# Patient Record
Sex: Male | Born: 1970 | Race: White | Hispanic: No | Marital: Single | Smoking: Current every day smoker
Health system: Southern US, Community
[De-identification: ages and names within clinical notes are randomized; demographics above are authoritative.]

## PROBLEM LIST (undated history)

## (undated) DIAGNOSIS — Z8719 Personal history of other diseases of the digestive system: Secondary | ICD-10-CM

## (undated) DIAGNOSIS — J209 Acute bronchitis, unspecified: Secondary | ICD-10-CM

## (undated) DIAGNOSIS — Z8711 Personal history of peptic ulcer disease: Secondary | ICD-10-CM

## (undated) DIAGNOSIS — A4902 Methicillin resistant Staphylococcus aureus infection, unspecified site: Secondary | ICD-10-CM

---

## 2012-09-27 ENCOUNTER — Emergency Department (HOSPITAL_COMMUNITY)
Admission: EM | Admit: 2012-09-27 | Discharge: 2012-09-27 | Disposition: A | Discharge status: Home / Self Care | Payer: Self-pay | Attending: Emergency Medicine | Admitting: Emergency Medicine

## 2012-09-27 ENCOUNTER — Emergency Department (HOSPITAL_COMMUNITY): Payer: Self-pay

## 2012-09-27 ENCOUNTER — Encounter (HOSPITAL_COMMUNITY): Payer: Self-pay | Admitting: Emergency Medicine

## 2012-09-27 DIAGNOSIS — M79609 Pain in unspecified limb: Secondary | ICD-10-CM | POA: Insufficient documentation

## 2012-09-27 DIAGNOSIS — Z87828 Personal history of other (healed) physical injury and trauma: Secondary | ICD-10-CM | POA: Insufficient documentation

## 2012-09-27 DIAGNOSIS — M5441 Lumbago with sciatica, right side: Secondary | ICD-10-CM

## 2012-09-27 DIAGNOSIS — Z8709 Personal history of other diseases of the respiratory system: Secondary | ICD-10-CM | POA: Insufficient documentation

## 2012-09-27 DIAGNOSIS — F17200 Nicotine dependence, unspecified, uncomplicated: Secondary | ICD-10-CM | POA: Insufficient documentation

## 2012-09-27 DIAGNOSIS — Z8614 Personal history of Methicillin resistant Staphylococcus aureus infection: Secondary | ICD-10-CM | POA: Insufficient documentation

## 2012-09-27 DIAGNOSIS — M5430 Sciatica, unspecified side: Secondary | ICD-10-CM | POA: Insufficient documentation

## 2012-09-27 HISTORY — DX: Methicillin resistant Staphylococcus aureus infection, unspecified site: A49.02

## 2012-09-27 HISTORY — DX: Acute bronchitis, unspecified: J20.9

## 2012-09-27 MED ORDER — NAPROXEN 500 MG PO TABS
500.0000 mg | ORAL_TABLET | Freq: Two times a day (BID) | ORAL | Status: AC
Start: 2012-09-27 — End: ?

## 2012-09-27 MED ORDER — HYDROCODONE-ACETAMINOPHEN 5-325 MG PO TABS: 1.0000 | ORAL_TABLET | ORAL | Status: DC | PRN

## 2012-09-27 MED ORDER — CYCLOBENZAPRINE HCL 10 MG PO TABS: 10.0000 mg | ORAL_TABLET | Freq: Two times a day (BID) | ORAL | Status: AC | PRN

## 2012-09-27 NOTE — ED Provider Notes (Signed)
History    CSN: [id number] Arrival date & time 09/27/12  1625  First MD Initiated Contact with Patient 09/27/12 1635     Chief Complaint  Patient presents with  . Back Pain  . Leg Pain   (Consider location/radiation/quality/duration/timing/severity/associated sxs/prior Treatment) Patient is a 42 y.o. male presenting with back pain and leg pain. The history is provided by the patient.  Back Pain Location:  Lumbar spine Radiates to:  R posterior upper leg and R knee Pain severity:  Severe Pain is:  Same all the time Onset quality:  Gradual Duration: 2 years. Timing:  Intermittent Progression:  Worsening Chronicity:  Chronic Relieved by:  Nothing Worsened by:  Standing, twisting and movement Associated symptoms: leg pain   Associated symptoms: no abdominal pain, no bladder incontinence, no bowel incontinence, no chest pain, no dysuria, no fever and no headaches   Leg Pain Associated symptoms: back pain   Associated symptoms: no fever and no neck pain    Alan Lawrence is a 42 y.o. male who presents to the ED with low back pain and right knee pain. He states that after injury of his back for a fall over 2 years ago he has chronic back pain. For the past few days the pain has become sever and he has pain that radiates down his right leg and is causing pain in his right knee. He sometimes feels a pop in his lower back and then has severe pain but then it pops again and the pain goes away.  He has not been evaluated by an orthopaedist because he has no insurance. Nothing helps the pain.    Past Medical History  Diagnosis Date  . Bronchitis, acute   . MRSA (methicillin resistant Staphylococcus aureus) infection    History reviewed. No pertinent past surgical history. Family History  Problem Relation Age of Onset  . Cancer Other    History  Substance Use Topics  . Smoking status: Current Every Day Smoker -- 0.75 packs/day for 16 years    Types: Cigarettes  . Smokeless tobacco:  Never Used  . Alcohol Use: No    Review of Systems  Constitutional: Negative for fever, chills and appetite change.  HENT: Negative for neck pain.   Respiratory: Negative for cough.   Cardiovascular: Negative for chest pain.  Gastrointestinal: Negative for nausea, vomiting, abdominal pain and bowel incontinence.  Genitourinary: Negative for bladder incontinence, dysuria, urgency and frequency.  Musculoskeletal: Positive for back pain.  Skin: Negative for wound.  Neurological: Negative for light-headedness and headaches.  Psychiatric/Behavioral: The patient is not nervous/anxious.     Allergies  Codeine; Dichlorphenamide; and Tramadol  Home Medications  No current outpatient prescriptions on file. BP 142/84  Pulse 75  Temp(Src) 98.3 F (36.8 C) (Oral)  Resp 20  Ht 6' 0.5" (1.842 m)  Wt 230 lb (104.327 kg)  BMI 30.75 kg/m2  SpO2 98% Physical Exam  Nursing note and vitals reviewed. Constitutional: He is oriented to person, place, and time. He appears well-developed and well-nourished. No distress.  HENT:  Head: Normocephalic and atraumatic.  Eyes: Conjunctivae and EOM are normal. Pupils are equal, round, and reactive to light.  Neck: Neck supple.  Cardiovascular: Normal rate and regular rhythm.   Pulmonary/Chest: Effort normal and breath sounds normal.  Abdominal: Soft. There is no tenderness.  Musculoskeletal:       Lumbar back: He exhibits decreased range of motion, tenderness and spasm. He exhibits no deformity, no laceration and normal pulse.  Back:  Pedal pulses equal bilateral, adequate circulation, good touch sensation. Pain with straight leg raises on the right. Ambulatory with steady gait, no foot drag. Full range of motion of back without difficulty but does complain of pain. Pain with palpation of lumbar spine and over right sciatic nerve.  Neurological: He is alert and oriented to person, place, and time. He has normal strength and normal reflexes. No  cranial nerve deficit or sensory deficit. Gait normal.  Skin: Skin is warm and dry.  Psychiatric: He has a normal mood and affect. His behavior is normal.   Dg Lumbar Spine Complete  09/27/2012   *RADIOLOGY REPORT*  Clinical Data: Back and leg pain  LUMBAR SPINE - COMPLETE 4+ VIEW  Comparison: 07/21/2011  Findings: Minor endplate bony spurring.  Normal alignment without compression fracture, wedge shaped deformity or focal kyphosis. Negative for pars defects.  Normal pedicles and SI joints. Nonobstructive bowel gas pattern.  IMPRESSION: Minor endplate bony spurring.  No acute finding   Original Report Authenticated By: Judie Petit. Shick, M.D.    ED Course  Procedures   MDM  42 y.o. male with low back pain that is chronic with acute flare up.  Bone spurs per x-ray. Will treat symptoms and patient is to follow up with ortho for further evaluation.   Discussed with the patient x-ray and clinical findings and all questioned fully answered.    Medication List    TAKE these medications       cyclobenzaprine 10 MG tablet  Commonly known as:  FLEXERIL  Take 1 tablet (10 mg total) by mouth 2 (two) times daily as needed for muscle spasms.     HYDROcodone-acetaminophen 5-325 MG per tablet  Commonly known as:  NORCO/VICODIN  Take 1 tablet by mouth every 4 (four) hours as needed.     naproxen 500 MG tablet  Commonly known as:  NAPROSYN  Take 1 tablet (500 mg total) by mouth 2 (two) times daily.         [facility] Orlene Och, [place] 09/27/12 1757

## 2012-09-27 NOTE — ED Notes (Signed)
Per patient c/o lower back pain that is causing right leg pain. Per patient right leg pain is worse then the back. Patient states the pain has been intermittent x1 year but is progressively getting worse. Patient reports going to [facility] and being diagnosed with bone spurs.

## 2012-09-27 NOTE — ED Notes (Signed)
Pt c/o lower back pain that radiates down both legs x2 years.

## 2012-09-28 NOTE — ED Provider Notes (Signed)
Medical screening examination/treatment/procedure(s) were performed by non-physician practitioner and as supervising physician I was immediately available for consultation/collaboration.   Carleene Cooper III, MD 09/28/12 [phone]

## 2015-05-01 ENCOUNTER — Emergency Department (HOSPITAL_COMMUNITY)
Admission: EM | Admit: 2015-05-01 | Discharge: 2015-05-01 | Disposition: A | Discharge status: Home / Self Care | Payer: Self-pay | Attending: Emergency Medicine | Admitting: Emergency Medicine

## 2015-05-01 ENCOUNTER — Emergency Department (HOSPITAL_COMMUNITY): Payer: Self-pay

## 2015-05-01 ENCOUNTER — Encounter (HOSPITAL_COMMUNITY): Payer: Self-pay | Admitting: *Deleted

## 2015-05-01 DIAGNOSIS — Z8709 Personal history of other diseases of the respiratory system: Secondary | ICD-10-CM | POA: Insufficient documentation

## 2015-05-01 DIAGNOSIS — Z8719 Personal history of other diseases of the digestive system: Secondary | ICD-10-CM | POA: Insufficient documentation

## 2015-05-01 DIAGNOSIS — S56912A Strain of unspecified muscles, fascia and tendons at forearm level, left arm, initial encounter: Secondary | ICD-10-CM | POA: Insufficient documentation

## 2015-05-01 DIAGNOSIS — Y998 Other external cause status: Secondary | ICD-10-CM | POA: Insufficient documentation

## 2015-05-01 DIAGNOSIS — Y9389 Activity, other specified: Secondary | ICD-10-CM | POA: Insufficient documentation

## 2015-05-01 DIAGNOSIS — X58XXXA Exposure to other specified factors, initial encounter: Secondary | ICD-10-CM | POA: Insufficient documentation

## 2015-05-01 DIAGNOSIS — F17210 Nicotine dependence, cigarettes, uncomplicated: Secondary | ICD-10-CM | POA: Insufficient documentation

## 2015-05-01 DIAGNOSIS — Z8614 Personal history of Methicillin resistant Staphylococcus aureus infection: Secondary | ICD-10-CM | POA: Insufficient documentation

## 2015-05-01 DIAGNOSIS — S8991XA Unspecified injury of right lower leg, initial encounter: Secondary | ICD-10-CM | POA: Insufficient documentation

## 2015-05-01 DIAGNOSIS — M25561 Pain in right knee: Secondary | ICD-10-CM

## 2015-05-01 DIAGNOSIS — Y9289 Other specified places as the place of occurrence of the external cause: Secondary | ICD-10-CM | POA: Insufficient documentation

## 2015-05-01 DIAGNOSIS — Z791 Long term (current) use of non-steroidal anti-inflammatories (NSAID): Secondary | ICD-10-CM | POA: Insufficient documentation

## 2015-05-01 HISTORY — DX: Personal history of peptic ulcer disease: Z87.11

## 2015-05-01 HISTORY — DX: Personal history of other diseases of the digestive system: Z87.19

## 2015-05-01 MED ORDER — DICLOFENAC SODIUM 75 MG PO TBEC: 75.0000 mg | DELAYED_RELEASE_TABLET | Freq: Two times a day (BID) | ORAL | Status: AC

## 2015-05-01 NOTE — ED Notes (Signed)
Pt comes in with left arm pain for around a month. Pt states it started after he helped him mother change the tire on his car. In addition pt c/o right knee pain.

## 2015-05-01 NOTE — Discharge Instructions (Signed)
Elastic Bandage and RICE °WHAT DOES AN ELASTIC BANDAGE DO? °Elastic bandages come in different shapes and sizes. They generally provide support to your injury and reduce swelling while you are healing, but they can perform different functions. Your health care provider will help you to decide what is best for your protection, recovery, or rehabilitation following an injury. °WHAT ARE SOME GENERAL TIPS FOR USING AN ELASTIC BANDAGE? °· Use the bandage as directed by the maker of the bandage that you are using. °· Do not wrap the bandage too tightly. This may cut off the circulation in the arm or leg in the area below the bandage. °¨ If part of your body beyond the bandage becomes blue, numb, cold, swollen, or is more painful, your bandage is most likely too tight. If this occurs, remove your bandage and reapply it more loosely. °· See your health care provider if the bandage seems to be making your problems worse rather than better. °· An elastic bandage should be removed and reapplied every 3-4 hours or as directed by your health care provider. °WHAT IS RICE? °The routine care of many injuries includes rest, ice, compression, and elevation (RICE therapy).  °Rest °Rest is required to allow your body to heal. Generally, you can resume your routine activities when you are comfortable and have been given permission by your health care provider. °Ice °Icing your injury helps to keep the swelling down and it reduces pain. Do not apply ice directly to your skin. °· Put ice in a plastic bag. °· Place a towel between your skin and the bag. °· Leave the ice on for 20 minutes, 2-3 times per day. °Do this for as long as you are directed by your health care provider. °Compression °Compression helps to keep swelling down, gives support, and helps with discomfort. Compression may be done with an elastic bandage. °Elevation °Elevation helps to reduce swelling and it decreases pain. If possible, your injured area should be placed at  or above the level of your heart or the center of your chest. °WHEN SHOULD I SEEK MEDICAL CARE? °You should seek medical care if: °· You have persistent pain and swelling. °· Your symptoms are getting worse rather than improving. °These symptoms may indicate that further evaluation or further X-rays are needed. Sometimes, X-rays may not show a small broken bone (fracture) until a number of days later. Make a follow-up appointment with your health care provider. Ask when your X-ray results will be ready. Make sure that you get your X-ray results. °WHEN SHOULD I SEEK IMMEDIATE MEDICAL CARE? °You should seek immediate medical care if: °· You have a sudden onset of severe pain at or below the area of your injury. °· You develop redness or increased swelling around your injury. °· You have tingling or numbness at or below the area of your injury that does not improve after you remove the elastic bandage. °  °This information is not intended to replace advice given to you by your health care provider. Make sure you discuss any questions you have with your health care provider. °  °Document Released: 09/11/2001 Document Revised: 12/11/2014 Document Reviewed: 11/05/2013 °Elsevier Interactive Patient Education ©2016 [employer] ° °
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
## 2016-08-29 IMAGING — DX DG KNEE COMPLETE 4+V*R*
4 series · 4 of 4 positions shown · non-contrast
Comparison: None in PACs

CLINICAL DATA: Status post fall 2 months ago with cutaneous
abrasions at that time. Persistent intermittent anterior pain and
swelling.

EXAM:
RIGHT KNEE - COMPLETE 4+ VIEW

[knee ap]
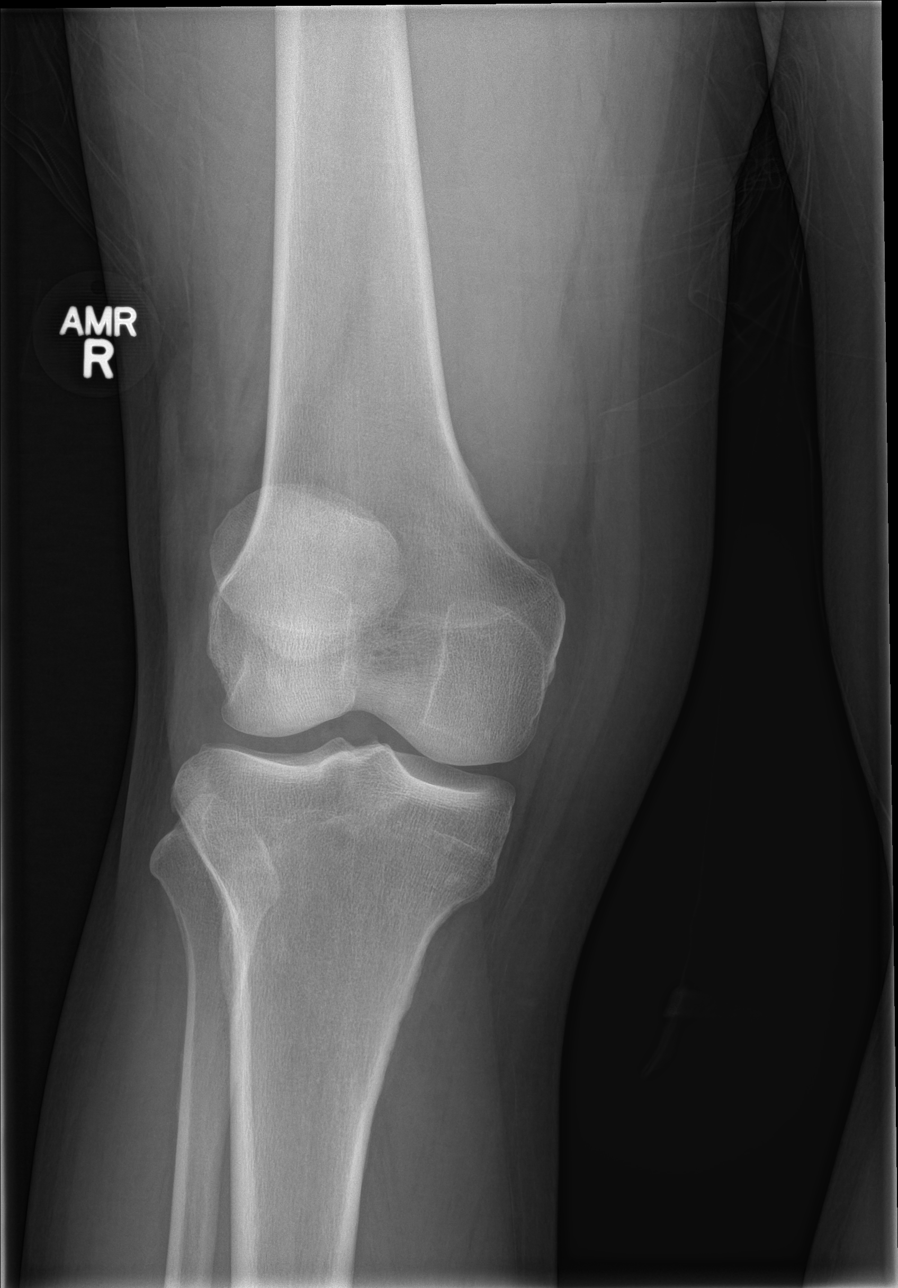

[knee obl (1 of 2)]
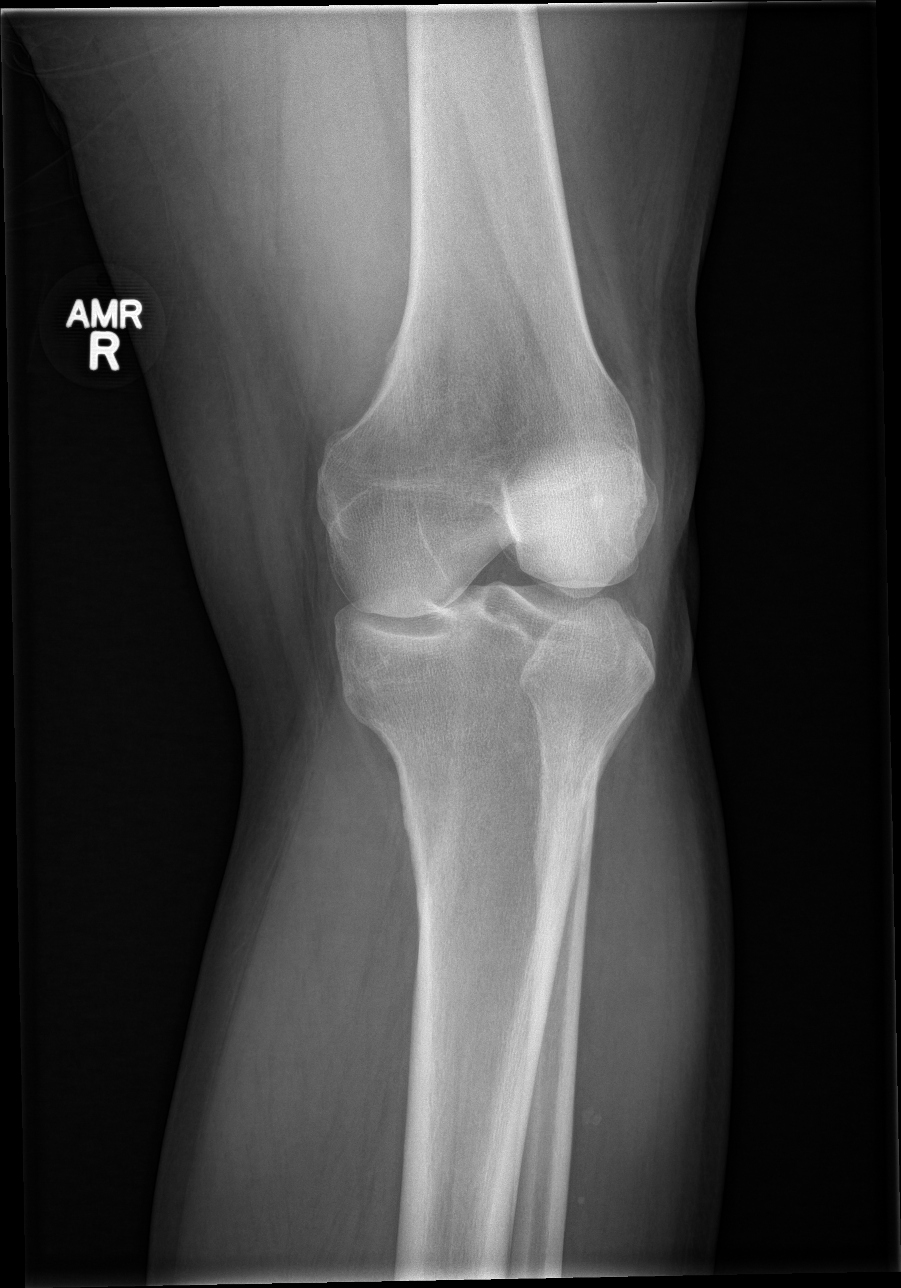

[knee obl (2 of 2)]
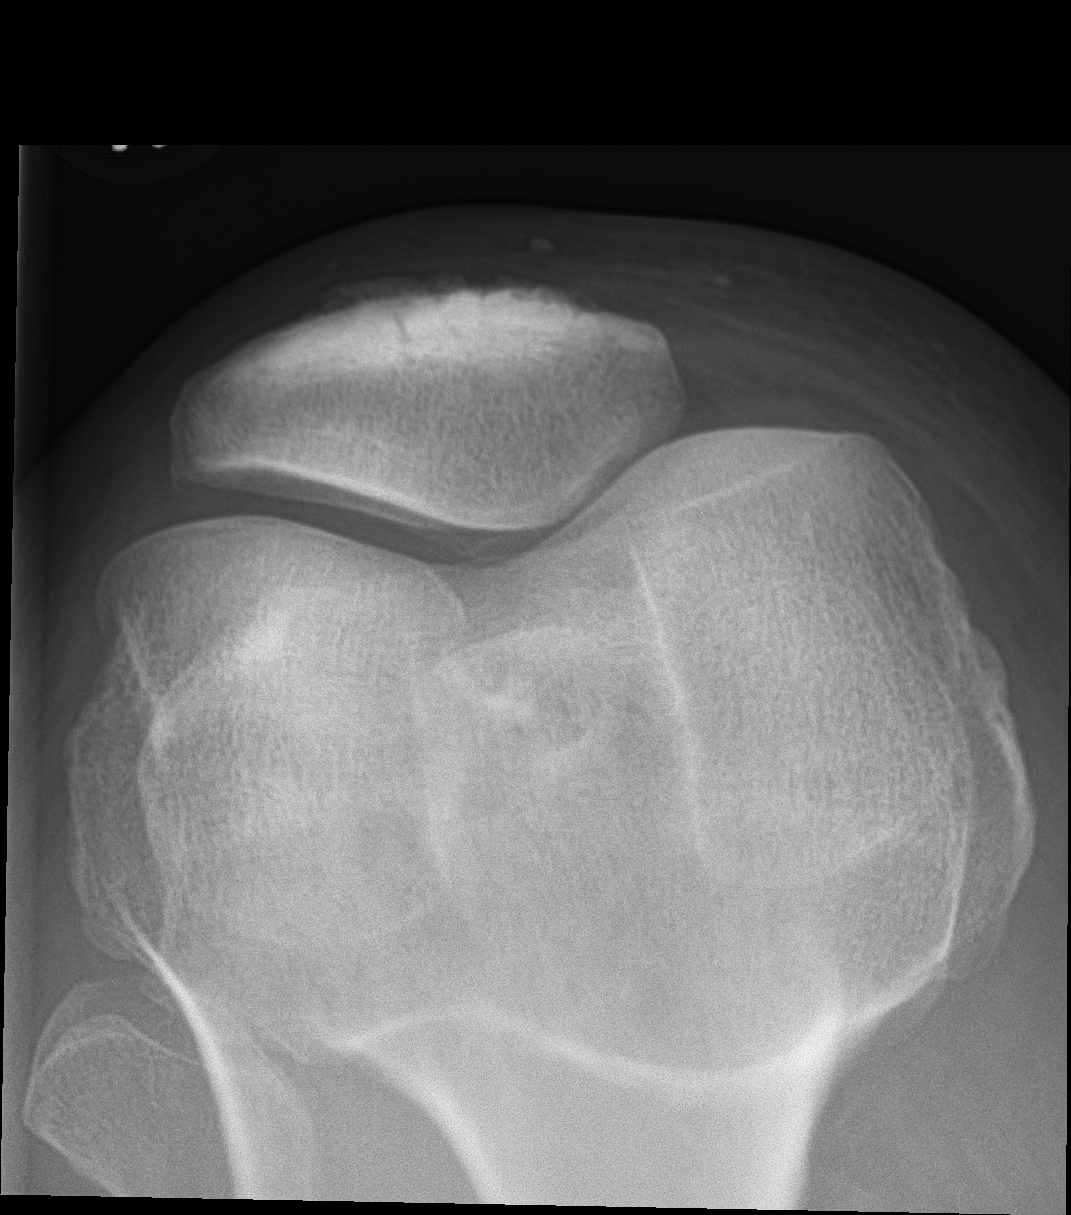

[knee lat]
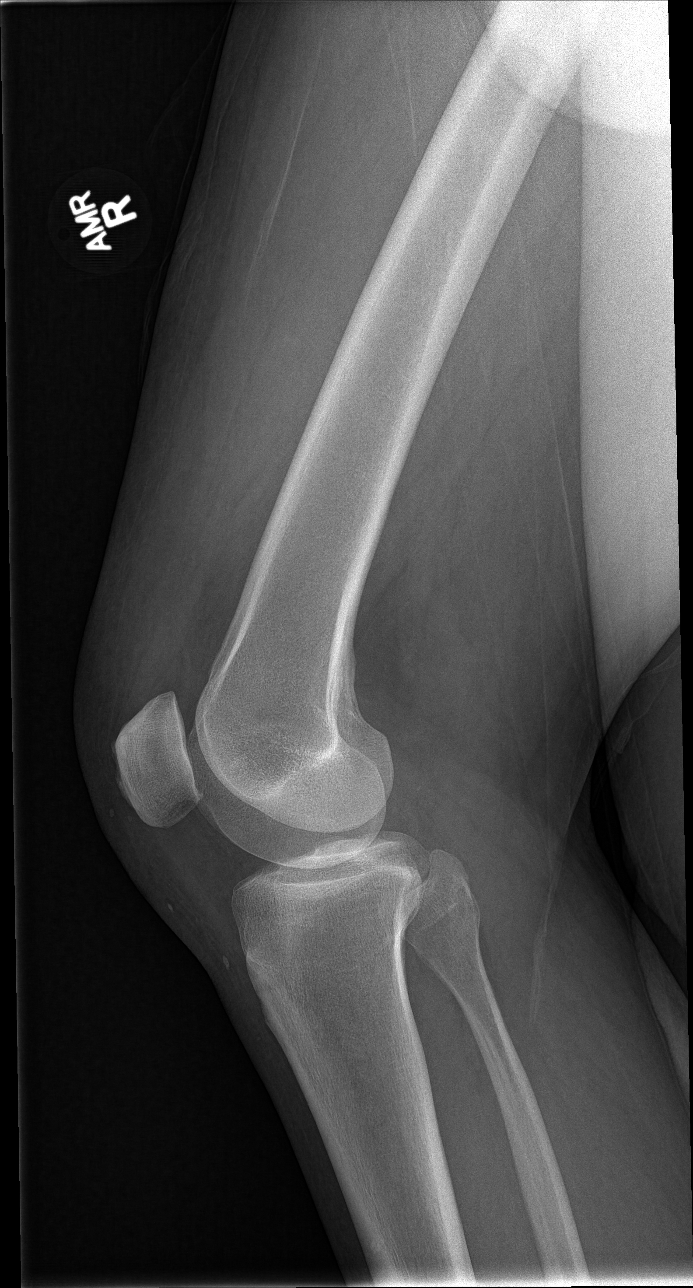

[4 of 4 positions shown; findings below may reference images not displayed]

FINDINGS: The bones of the right knee are adequately mineralized. There is no
acute or old fracture nor dislocation. The joint spaces are
reasonably well-maintained. There is no joint effusion. There is a
tiny spur arising from the inferior articular margin of the patella.
IMPRESSION: There is no acute or significant chronic bony abnormality of the
right knee. There is no joint effusion.

## 2017-02-21 ENCOUNTER — Other Ambulatory Visit: Payer: Self-pay

## 2017-02-21 ENCOUNTER — Encounter (HOSPITAL_COMMUNITY): Payer: Self-pay

## 2017-02-21 ENCOUNTER — Emergency Department (HOSPITAL_COMMUNITY)
Admission: EM | Admit: 2017-02-21 | Discharge: 2017-02-21 | Disposition: A | Discharge status: Home / Self Care | Payer: Self-pay | Attending: Emergency Medicine | Admitting: Emergency Medicine

## 2017-02-21 DIAGNOSIS — R197 Diarrhea, unspecified: Secondary | ICD-10-CM | POA: Insufficient documentation

## 2017-02-21 DIAGNOSIS — R1110 Vomiting, unspecified: Secondary | ICD-10-CM | POA: Insufficient documentation

## 2017-02-21 DIAGNOSIS — Z5321 Procedure and treatment not carried out due to patient leaving prior to being seen by health care provider: Secondary | ICD-10-CM | POA: Insufficient documentation

## 2017-02-21 NOTE — ED Notes (Signed)
Called to triage x 1with no answer  

## 2017-02-21 NOTE — ED Triage Notes (Signed)
Reports of vomiting, diarrhea and body aches x1 month.

## 2020-08-03 DEATH — deceased
